# Patient Record
Sex: Male | Born: 1960 | Race: Asian | Hispanic: No | Marital: Married
Health system: Southern US, Community
[De-identification: ages and names within clinical notes are randomized; demographics above are authoritative.]

---

## 2010-05-18 ENCOUNTER — Emergency Department (HOSPITAL_COMMUNITY): Admission: EM | Admit: 2010-05-18 | Discharge: 2010-05-18 | Payer: Self-pay | Admitting: Emergency Medicine

## 2010-10-09 LAB — CBC
HCT: 43.4 % (ref 39.0–52.0)
Hemoglobin: 14.4 g/dL (ref 13.0–17.0)
MCH: 27.8 pg (ref 26.0–34.0)
MCHC: 33.2 g/dL (ref 30.0–36.0)
MCV: 83.8 fL (ref 78.0–100.0)
Platelets: 218 10*3/uL (ref 150–400)
RBC: 5.18 MIL/uL (ref 4.22–5.81)
RDW: 13.6 % (ref 11.5–15.5)
WBC: 4.8 10*3/uL (ref 4.0–10.5)

## 2010-10-09 LAB — DIFFERENTIAL
Basophils Absolute: 0 10*3/uL (ref 0.0–0.1)
Basophils Relative: 0 % (ref 0–1)
Eosinophils Absolute: 0.2 10*3/uL (ref 0.0–0.7)
Eosinophils Relative: 4 % (ref 0–5)
Lymphocytes Relative: 43 % (ref 12–46)
Lymphs Abs: 2 10*3/uL (ref 0.7–4.0)
Monocytes Absolute: 0.3 10*3/uL (ref 0.1–1.0)
Monocytes Relative: 6 % (ref 3–12)
Neutro Abs: 2.3 10*3/uL (ref 1.7–7.7)
Neutrophils Relative %: 48 % (ref 43–77)

## 2010-10-09 LAB — POCT I-STAT, CHEM 8
BUN: 18 mg/dL (ref 6–23)
Calcium, Ion: 1.13 mmol/L (ref 1.12–1.32)
Chloride: 107 mEq/L (ref 96–112)
Creatinine, Ser: 1.1 mg/dL (ref 0.4–1.5)
Glucose, Bld: 105 mg/dL — ABNORMAL HIGH (ref 70–99)
HCT: 45 % (ref 39.0–52.0)
Hemoglobin: 15.3 g/dL (ref 13.0–17.0)
Potassium: 3.7 mEq/L (ref 3.5–5.1)
Sodium: 140 mEq/L (ref 135–145)
TCO2: 25 mmol/L (ref 0–100)

## 2010-10-09 LAB — HEMOCCULT GUIAC POC 1CARD (OFFICE): Fecal Occult Bld: POSITIVE

## 2011-06-27 ENCOUNTER — Ambulatory Visit (HOSPITAL_COMMUNITY)
Admission: RE | Admit: 2011-06-27 | Discharge: 2011-06-27 | Disposition: A | Discharge status: Home / Self Care | Payer: Self-pay | Source: Ambulatory Visit | Attending: Rheumatology | Admitting: Rheumatology

## 2011-06-27 ENCOUNTER — Other Ambulatory Visit (HOSPITAL_COMMUNITY): Payer: Self-pay | Admitting: Rheumatology

## 2011-06-27 DIAGNOSIS — R937 Abnormal findings on diagnostic imaging of other parts of musculoskeletal system: Secondary | ICD-10-CM | POA: Insufficient documentation

## 2011-06-27 DIAGNOSIS — M25476 Effusion, unspecified foot: Secondary | ICD-10-CM | POA: Insufficient documentation

## 2011-06-27 DIAGNOSIS — M25473 Effusion, unspecified ankle: Secondary | ICD-10-CM | POA: Insufficient documentation

## 2011-06-27 DIAGNOSIS — R52 Pain, unspecified: Secondary | ICD-10-CM

## 2011-06-27 DIAGNOSIS — M25579 Pain in unspecified ankle and joints of unspecified foot: Secondary | ICD-10-CM | POA: Insufficient documentation

## 2015-03-03 ENCOUNTER — Emergency Department (HOSPITAL_COMMUNITY)
Admission: EM | Admit: 2015-03-03 | Discharge: 2015-03-03 | Disposition: A | Discharge status: Home / Self Care | Payer: 59 | Attending: Emergency Medicine | Admitting: Emergency Medicine

## 2015-03-03 DIAGNOSIS — M1000 Idiopathic gout, unspecified site: Secondary | ICD-10-CM | POA: Diagnosis not present

## 2015-03-03 DIAGNOSIS — M25561 Pain in right knee: Secondary | ICD-10-CM | POA: Diagnosis present

## 2015-03-03 LAB — COMPREHENSIVE METABOLIC PANEL
ALT: 19 U/L (ref 17–63)
AST: 22 U/L (ref 15–41)
Albumin: 3.8 g/dL (ref 3.5–5.0)
Alkaline Phosphatase: 69 U/L (ref 38–126)
Anion gap: 7 (ref 5–15)
BUN: 13 mg/dL (ref 6–20)
CO2: 23 mmol/L (ref 22–32)
Calcium: 8.7 mg/dL — ABNORMAL LOW (ref 8.9–10.3)
Chloride: 107 mmol/L (ref 101–111)
Creatinine, Ser: 0.84 mg/dL (ref 0.61–1.24)
GFR calc Af Amer: >60 mL/min (ref >60)
GFR calc non Af Amer: >60 mL/min (ref >60)
Glucose, Bld: 141 mg/dL — ABNORMAL HIGH (ref 65–99)
Potassium: 3.5 mmol/L (ref 3.5–5.1)
Sodium: 137 mmol/L (ref 135–145)
Total Bilirubin: 0.6 mg/dL (ref 0.3–1.2)
Total Protein: 7.3 g/dL (ref 6.5–8.1)

## 2015-03-03 LAB — CBC WITH DIFFERENTIAL/PLATELET
Basophils Absolute: 0 10*3/uL (ref 0.0–0.1)
Basophils Relative: 0 % (ref 0–1)
Eosinophils Absolute: 0.1 10*3/uL (ref 0.0–0.7)
Eosinophils Relative: 1 % (ref 0–5)
HCT: 38.1 % — ABNORMAL LOW (ref 39.0–52.0)
Hemoglobin: 12.2 g/dL — ABNORMAL LOW (ref 13.0–17.0)
Lymphocytes Relative: 21 % (ref 12–46)
Lymphs Abs: 1.9 10*3/uL (ref 0.7–4.0)
MCH: 27 pg (ref 26.0–34.0)
MCHC: 32 g/dL (ref 30.0–36.0)
MCV: 84.3 fL (ref 78.0–100.0)
Monocytes Absolute: 0.5 10*3/uL (ref 0.1–1.0)
Monocytes Relative: 6 % (ref 3–12)
Neutro Abs: 6.3 10*3/uL (ref 1.7–7.7)
Neutrophils Relative %: 72 % (ref 43–77)
Platelets: 330 10*3/uL (ref 150–400)
RBC: 4.52 MIL/uL (ref 4.22–5.81)
RDW: 14 % (ref 11.5–15.5)
WBC: 8.8 10*3/uL (ref 4.0–10.5)

## 2015-03-03 MED ORDER — OXYCODONE-ACETAMINOPHEN 5-325 MG PO TABS: 1.0000 | ORAL_TABLET | Freq: Four times a day (QID) | ORAL | Status: DC | PRN

## 2015-03-03 MED ORDER — IBUPROFEN 800 MG PO TABS: 800.0000 mg | ORAL_TABLET | Freq: Three times a day (TID) | ORAL | Status: AC

## 2015-03-03 MED ORDER — MORPHINE SULFATE 4 MG/ML IJ SOLN
4.0000 mg | Freq: Once | INTRAMUSCULAR | Status: AC
  Administered 2015-03-03: 4 mg via INTRAVENOUS
  Filled 2015-03-03: qty 1

## 2015-03-03 MED ORDER — PREDNISONE 20 MG PO TABS
60.0000 mg | ORAL_TABLET | Freq: Once | ORAL | Status: AC
  Administered 2015-03-03: 60 mg via ORAL
  Filled 2015-03-03: qty 3

## 2015-03-03 MED ORDER — HYDROMORPHONE HCL 1 MG/ML IJ SOLN
1.0000 mg | Freq: Once | INTRAMUSCULAR | Status: AC
  Administered 2015-03-03: 1 mg via INTRAVENOUS
  Filled 2015-03-03: qty 1

## 2015-03-03 MED ORDER — SODIUM CHLORIDE 0.9 % IV BOLUS (SEPSIS)
1000.0000 mL | Freq: Once | INTRAVENOUS | Status: AC
  Administered 2015-03-03: 1000 mL via INTRAVENOUS

## 2015-03-03 MED ORDER — PREDNISONE 20 MG PO TABS: ORAL_TABLET | ORAL | Status: AC

## 2015-03-03 MED ORDER — ONDANSETRON 8 MG PO TBDP
8.0000 mg | ORAL_TABLET | Freq: Once | ORAL | Status: AC
  Administered 2015-03-03: 8 mg via ORAL
  Filled 2015-03-03: qty 1

## 2015-03-03 NOTE — ED Notes (Addendum)
Addendum-patient reports taking approximately nine 0.6mg  Colchicine today (within 12 hours) and Ibuprofen (unknown dosage) 3-4 tablets every three hours yesterday.

## 2015-03-03 NOTE — ED Notes (Addendum)
Spoke with Poison Control:   Recommendations: (expected N/V/D possibly) Symptomatic treatment BUN, Creatinine, Electrolytes for renal check d/t Ibuprofen dosages  Spoke with Revonda Standard at [employer]

## 2015-03-03 NOTE — Discharge Instructions (Signed)
Take motrin 800 mg three times daily for the next 2-3 days until your pain is under control then as needed.  Take prednisone as prescribed.   Take percocet for severe pain. DO NOT drive with it.   See your doctor.   Return to ER if you have severe pain, fever, worse joint swelling, unable to walk.

## 2015-03-03 NOTE — ED Notes (Signed)
Bed: EA54 Expected date:  Expected time:  Means of arrival:  Comments: Gout, took mail order meds and "feels funny"

## 2015-03-03 NOTE — ED Provider Notes (Signed)
CSN: [id number]     Arrival date & time 03/03/15  1550 History   First MD Initiated Contact with Patient 03/03/15 1622     Chief Complaint  Patient presents with  . Gout Medication Overdose      (Consider location/radiation/quality/duration/timing/severity/associated sxs/prior Treatment) The history is provided by the patient.  Philip Holmes is a 54 y.o. male here with R knee pain. Patient was diagnosed with gout several weeks ago by primary care doctor. He was prescribed allopurinol but ran out of it. Patient has been having worsening right knee pain for the last 3 days. He took Motrin yesterday didn't help and took his friend's colchicine today. He took about 9 pills of 0.6 mg of colchicine and his pain is minimally improved. Denies any nausea or vomiting. He came in because he still in severe pain. Denies fever or chills or hx of septic joint.     No past medical history on file. No past surgical history on file. No family history on file. History  Substance Use Topics  . Smoking status: Not on file  . Smokeless tobacco: Not on file  . Alcohol Use: Not on file    Review of Systems  Musculoskeletal:       R knee pain   All other systems reviewed and are negative.     Allergies  Review of patient's allergies indicates no known allergies.  Home Medications   Prior to Admission medications   Medication Sig Start Date End Date Taking? Authorizing Provider  colchicine 0.6 MG tablet Take 0.12-0.18 mg by mouth every 3 (three) hours as needed (for gout).   Yes Historical Provider, MD  ibuprofen (ADVIL,MOTRIN) 200 MG tablet Take 600-800 mg by mouth every 3 (three) hours as needed for fever, headache, mild pain, moderate pain or cramping.   Yes Historical Provider, MD   BP 132/91 mmHg  Pulse 73  Temp(Src) 98.6 F (37 C) (Oral)  Resp 18  SpO2 98% Physical Exam  Constitutional: He is oriented to person, place, and time.  Uncomfortable   HENT:  Head: Normocephalic.   Mouth/Throat: Oropharynx is clear and moist.  Eyes: Conjunctivae are normal. Pupils are equal, round, and reactive to light.  Neck: Normal range of motion. Neck supple.  Cardiovascular: Normal rate, regular rhythm and normal heart sounds.   Pulmonary/Chest: Effort normal and breath sounds normal. No respiratory distress. He has no wheezes. He has no rales.  Abdominal: Soft. Bowel sounds are normal. He exhibits no distension. There is no tenderness. There is no rebound.  Musculoskeletal:  R knee redness around the prepatellar space, no obvious knee effusion, nl ROM R knee. Minimal R ankle swelling with no redness.   Neurological: He is alert and oriented to person, place, and time.  Skin: Skin is warm and dry.  Psychiatric: He has a normal mood and affect. His behavior is normal. Judgment and thought content normal.  Nursing note and vitals reviewed.   ED Course  Procedures (including critical care time) Labs Review Labs Reviewed  CBC WITH DIFFERENTIAL/PLATELET - Abnormal; Notable for the following:    Hemoglobin 12.2 (*)    HCT 38.1 (*)    All other components within normal limits  COMPREHENSIVE METABOLIC PANEL - Abnormal; Notable for the following:    Glucose, Bld 141 (*)    Calcium 8.7 (*)    All other components within normal limits    Imaging Review No results found.   EKG Interpretation None  MDM   Final diagnoses:  None   Philip Holmes is a 54 y.o. male here with R knee pain, colchicine use. Poison control contacted by nursing and recommend check Cr and observe for vomiting. Patient didn't take a toxic dose of colchicine. Knee pain likely prepatellar bursitis vs gout. No signs of septic knee. Will check labs, give pain meds and steroids for gout attack.   6:25 PM Cr nl. Felt better with pain meds and steroids. Will dc home with prednisone, percocet, motrin.     Richardean Canal, MD 03/03/15 [phone]

## 2015-03-03 NOTE — ED Notes (Signed)
Printed out foods to avoid with gout. Knows to follow up with PCP for Allopurinol prescription. No other c/c.

## 2015-03-03 NOTE — ED Notes (Signed)
Updated Revonda Standard at [employer] on patient's discharge condition.

## 2015-03-03 NOTE — ED Notes (Signed)
Per EMS- (vietnames speaking patient) has new diagnosis with gout. Out of Allopurinol. Friend gave him Colchicine-package indicates medication is possibly from [place]. Says he "feels funny." VS: 132 palp RR 20 SpO2 100% HR 84. Neurologically intact. Ambulatory for EMS.

## 2015-11-02 DIAGNOSIS — R030 Elevated blood-pressure reading, without diagnosis of hypertension: Secondary | ICD-10-CM | POA: Diagnosis not present

## 2015-11-02 DIAGNOSIS — M109 Gout, unspecified: Secondary | ICD-10-CM | POA: Diagnosis not present

## 2015-11-02 DIAGNOSIS — K625 Hemorrhage of anus and rectum: Secondary | ICD-10-CM | POA: Diagnosis not present

## 2015-11-02 DIAGNOSIS — M898X9 Other specified disorders of bone, unspecified site: Secondary | ICD-10-CM | POA: Diagnosis not present

## 2015-12-14 DIAGNOSIS — K921 Melena: Secondary | ICD-10-CM | POA: Diagnosis not present

## 2015-12-21 DIAGNOSIS — K921 Melena: Secondary | ICD-10-CM | POA: Diagnosis not present

## 2015-12-21 DIAGNOSIS — M109 Gout, unspecified: Secondary | ICD-10-CM | POA: Diagnosis not present

## 2016-01-11 DIAGNOSIS — K5730 Diverticulosis of large intestine without perforation or abscess without bleeding: Secondary | ICD-10-CM | POA: Diagnosis not present

## 2016-01-11 DIAGNOSIS — K921 Melena: Secondary | ICD-10-CM | POA: Diagnosis not present

## 2016-01-11 DIAGNOSIS — K648 Other hemorrhoids: Secondary | ICD-10-CM | POA: Diagnosis not present

## 2016-09-05 DIAGNOSIS — K30 Functional dyspepsia: Secondary | ICD-10-CM | POA: Diagnosis not present

## 2016-09-12 DIAGNOSIS — K5792 Diverticulitis of intestine, part unspecified, without perforation or abscess without bleeding: Secondary | ICD-10-CM | POA: Diagnosis not present

## 2017-09-18 DIAGNOSIS — Z23 Encounter for immunization: Secondary | ICD-10-CM | POA: Diagnosis not present

## 2017-09-18 DIAGNOSIS — M109 Gout, unspecified: Secondary | ICD-10-CM | POA: Diagnosis not present

## 2017-12-18 DIAGNOSIS — R05 Cough: Secondary | ICD-10-CM | POA: Diagnosis not present

## 2017-12-18 DIAGNOSIS — M109 Gout, unspecified: Secondary | ICD-10-CM | POA: Diagnosis not present

## 2019-12-27 DIAGNOSIS — M109 Gout, unspecified: Secondary | ICD-10-CM | POA: Diagnosis not present

## 2019-12-27 DIAGNOSIS — R7309 Other abnormal glucose: Secondary | ICD-10-CM | POA: Diagnosis not present

## 2019-12-27 DIAGNOSIS — Z125 Encounter for screening for malignant neoplasm of prostate: Secondary | ICD-10-CM | POA: Diagnosis not present

## 2019-12-27 DIAGNOSIS — Z0000 Encounter for general adult medical examination without abnormal findings: Secondary | ICD-10-CM | POA: Diagnosis not present

## 2019-12-27 DIAGNOSIS — K921 Melena: Secondary | ICD-10-CM | POA: Diagnosis not present

## 2019-12-27 DIAGNOSIS — E782 Mixed hyperlipidemia: Secondary | ICD-10-CM | POA: Diagnosis not present

## 2019-12-28 DIAGNOSIS — Z1211 Encounter for screening for malignant neoplasm of colon: Secondary | ICD-10-CM | POA: Diagnosis not present

## 2020-01-20 DIAGNOSIS — E119 Type 2 diabetes mellitus without complications: Secondary | ICD-10-CM | POA: Diagnosis not present

## 2020-01-20 DIAGNOSIS — M1A0721 Idiopathic chronic gout, left ankle and foot, with tophus (tophi): Secondary | ICD-10-CM | POA: Diagnosis not present

## 2020-01-20 DIAGNOSIS — E781 Pure hyperglyceridemia: Secondary | ICD-10-CM | POA: Diagnosis not present

## 2020-01-27 DIAGNOSIS — E782 Mixed hyperlipidemia: Secondary | ICD-10-CM | POA: Diagnosis not present

## 2020-01-27 DIAGNOSIS — E119 Type 2 diabetes mellitus without complications: Secondary | ICD-10-CM | POA: Diagnosis not present

## 2020-04-12 DIAGNOSIS — E119 Type 2 diabetes mellitus without complications: Secondary | ICD-10-CM | POA: Diagnosis not present

## 2020-04-12 DIAGNOSIS — M109 Gout, unspecified: Secondary | ICD-10-CM | POA: Diagnosis not present

## 2020-04-12 DIAGNOSIS — Z79899 Other long term (current) drug therapy: Secondary | ICD-10-CM | POA: Diagnosis not present

## 2020-04-12 DIAGNOSIS — E782 Mixed hyperlipidemia: Secondary | ICD-10-CM | POA: Diagnosis not present

## 2020-04-20 DIAGNOSIS — R05 Cough: Secondary | ICD-10-CM | POA: Diagnosis not present

## 2020-04-20 DIAGNOSIS — Z111 Encounter for screening for respiratory tuberculosis: Secondary | ICD-10-CM | POA: Diagnosis not present

## 2020-10-03 ENCOUNTER — Emergency Department (HOSPITAL_COMMUNITY)
Admission: EM | Admit: 2020-10-03 | Discharge: 2020-10-03 | Disposition: A | Discharge status: Home / Self Care | Payer: 59 | Attending: Emergency Medicine | Admitting: Emergency Medicine

## 2020-10-03 ENCOUNTER — Emergency Department (HOSPITAL_COMMUNITY): Payer: 59

## 2020-10-03 ENCOUNTER — Other Ambulatory Visit: Payer: Self-pay

## 2020-10-03 ENCOUNTER — Encounter (HOSPITAL_COMMUNITY): Payer: Self-pay | Admitting: *Deleted

## 2020-10-03 DIAGNOSIS — I10 Essential (primary) hypertension: Secondary | ICD-10-CM | POA: Diagnosis not present

## 2020-10-03 DIAGNOSIS — J029 Acute pharyngitis, unspecified: Secondary | ICD-10-CM | POA: Diagnosis present

## 2020-10-03 DIAGNOSIS — J36 Peritonsillar abscess: Secondary | ICD-10-CM | POA: Insufficient documentation

## 2020-10-03 LAB — CBC WITH DIFFERENTIAL/PLATELET
Abs Immature Granulocytes: 0.03 10*3/uL (ref 0.00–0.07)
Basophils Absolute: 0 10*3/uL (ref 0.0–0.1)
Basophils Relative: 0 %
Eosinophils Absolute: 0.2 10*3/uL (ref 0.0–0.5)
Eosinophils Relative: 2 %
HCT: 42.5 % (ref 39.0–52.0)
Hemoglobin: 13.7 g/dL (ref 13.0–17.0)
Immature Granulocytes: 0 %
Lymphocytes Relative: 23 %
Lymphs Abs: 2.1 10*3/uL (ref 0.7–4.0)
MCH: 28.1 pg (ref 26.0–34.0)
MCHC: 32.2 g/dL (ref 30.0–36.0)
MCV: 87.3 fL (ref 80.0–100.0)
Monocytes Absolute: 0.9 10*3/uL (ref 0.1–1.0)
Monocytes Relative: 9 %
Neutro Abs: 5.9 10*3/uL (ref 1.7–7.7)
Neutrophils Relative %: 66 %
Platelets: 281 10*3/uL (ref 150–400)
RBC: 4.87 MIL/uL (ref 4.22–5.81)
RDW: 14.4 % (ref 11.5–15.5)
WBC: 9.1 10*3/uL (ref 4.0–10.5)
nRBC: 0 % (ref 0.0–0.2)

## 2020-10-03 LAB — BASIC METABOLIC PANEL
Anion gap: 11 (ref 5–15)
BUN: 17 mg/dL (ref 6–20)
CO2: 25 mmol/L (ref 22–32)
Calcium: 9.5 mg/dL (ref 8.9–10.3)
Chloride: 103 mmol/L (ref 98–111)
Creatinine, Ser: 0.83 mg/dL (ref 0.61–1.24)
GFR, Estimated: >60 mL/min (ref >60)
Glucose, Bld: 92 mg/dL (ref 70–99)
Potassium: 3.3 mmol/L — ABNORMAL LOW (ref 3.5–5.1)
Sodium: 139 mmol/L (ref 135–145)

## 2020-10-03 LAB — GROUP A STREP BY PCR: Group A Strep by PCR: NOT DETECTED

## 2020-10-03 MED ORDER — HYDROCODONE-ACETAMINOPHEN 7.5-325 MG/15ML PO SOLN
10.0000 mL | Freq: Once | ORAL | Status: AC
  Administered 2020-10-03: 10 mL via ORAL
  Filled 2020-10-03: qty 15

## 2020-10-03 MED ORDER — SODIUM CHLORIDE 0.9 % IV BOLUS
1000.0000 mL | Freq: Once | INTRAVENOUS | Status: AC
  Administered 2020-10-03: 1000 mL via INTRAVENOUS

## 2020-10-03 MED ORDER — CLINDAMYCIN HCL 150 MG PO CAPS: 150.0000 mg | ORAL_CAPSULE | Freq: Four times a day (QID) | ORAL | 0 refills | Status: AC

## 2020-10-03 MED ORDER — DEXAMETHASONE SODIUM PHOSPHATE 10 MG/ML IJ SOLN
10.0000 mg | Freq: Once | INTRAMUSCULAR | Status: AC
  Administered 2020-10-03: 10 mg via INTRAVENOUS
  Filled 2020-10-03: qty 1

## 2020-10-03 MED ORDER — CLINDAMYCIN PHOSPHATE 300 MG/50ML IV SOLN
300.0000 mg | Freq: Once | INTRAVENOUS | Status: AC
  Administered 2020-10-03: 300 mg via INTRAVENOUS
  Filled 2020-10-03: qty 50

## 2020-10-03 MED ORDER — HYDROCODONE-ACETAMINOPHEN 5-325 MG PO TABS: 1.0000 | ORAL_TABLET | Freq: Four times a day (QID) | ORAL | 0 refills | Status: AC | PRN

## 2020-10-03 MED ORDER — IOHEXOL 300 MG/ML  SOLN
75.0000 mL | Freq: Once | INTRAMUSCULAR | Status: AC | PRN
  Administered 2020-10-03: 75 mL via INTRAVENOUS

## 2020-10-03 NOTE — Discharge Instructions (Signed)
Please take antibiotic as prescribed for the full duration.  Eat yogurt high in probiotic while taking antibiotic to prevent antibiotic related diarrhea.  Take pain medication as needed but be aware it may cause drowsiness.  Return if you have any concerns. It was my pleasure taking care of you today.   Vui lng u?ng khng sinh theo quy ??nh trong th?i gian ??y ??. ?n s?a chua c nhi?u probiotic trong khi u?ng thu?c khng sinh ?? ng?n ng?a tiu ch?y lin quan ??n khng sinh. U?ng thu?c gi?m ?au khi c?n thi?t nh?ng l?u  thu?c c th? gy bu?n ng?. Tr? l?i n?u b?n c b?t k? m?i quan tm no. Ti r?t vui khi ???c ch?m [employer] b?n hm nay.

## 2020-10-03 NOTE — ED Provider Notes (Signed)
[facility]-EMERGENCY DEPT Provider Note   CSN: [id number] Arrival date & time: 10/03/20  1304     History Chief Complaint  Patient presents with  . Sore Throat    Philip Holmes is a 60 y.o. male patient presenting for evaluation of throat pain.   History obtained with interpreter service as patient speaks [place]  Patient states he has had pain for the past 2 days.  It is persistent, worse when he swallows and eats.  He took Tylenol for pain without improvement of symptoms.  He has a mild cough, but mostly reports a lot of phlegm in his throat.  He states his voice is hoarse, but not muffled.  No fevers.  It is difficult for him to open his mouth due to pain.  He denies sick contacts.  No chest pain, shortness of breath, nausea, vomiting or abdominal pain.  He has a history of hypertension, hyperlipidemia, and gout for which he takes medication.   HPI     History reviewed. No pertinent past medical history.  There are no problems to display for this patient.   History reviewed. No pertinent surgical history.     No family history on file.     Home Medications Prior to Admission medications   Medication Sig Start Date End Date Taking? Authorizing Provider  colchicine 0.6 MG tablet Take 0.12-0.18 mg by mouth every 3 (three) hours as needed (for gout).    [provider]  ibuprofen (ADVIL,MOTRIN) 800 MG tablet Take 1 tablet (800 mg total) by mouth 3 (three) times daily. 03/03/15   Charlynne Pander, MD  oxyCODONE-acetaminophen (PERCOCET) 5-325 MG per tablet Take 1 tablet by mouth every 6 (six) hours as needed. 03/03/15   Charlynne Pander, MD  predniSONE (DELTASONE) 20 MG tablet Take 60 mg daily x 2 days then 40 mg daily x 2 days then 20 mg daily x 2 days 03/03/15   Charlynne Pander, MD    Allergies    Patient has no known allergies.  Review of Systems   Review of Systems  HENT: Positive for sore throat and trouble swallowing.    Respiratory: Positive for cough.   All other systems reviewed and are negative.   Physical Exam Updated Vital Signs BP (!) 145/97 (BP Location: Right Arm)   Pulse 86   Temp 97.8 F (36.6 C) (Oral)   Resp 20   SpO2 100%   Physical Exam Vitals and nursing note reviewed.  Constitutional:      General: He is not in acute distress.    Appearance: He is well-developed and well-nourished.     Comments: In NAD  HENT:     Head: Normocephalic and atraumatic.     Mouth/Throat:     Mouth: Mucous membranes are moist.     Pharynx: Pharyngeal swelling and posterior oropharyngeal erythema present.     Tonsils: Tonsillar abscess present. 1+ on the right. 3+ on the left.     Comments: Left-sided tonsillar swelling causing uvular deviation.  Minimal trismus.  No muffled voice.  Handling secretions easily. Eyes:     Extraocular Movements: EOM normal.     Conjunctiva/sclera: Conjunctivae normal.     Pupils: Pupils are equal, round, and reactive to light.  Cardiovascular:     Rate and Rhythm: Normal rate and regular rhythm.     Pulses: Normal pulses and intact distal pulses.  Pulmonary:     Effort: Pulmonary effort is normal. No respiratory distress.  Breath sounds: Normal breath sounds. No wheezing.     Comments: Clear lung sounds Abdominal:     General: There is no distension.     Palpations: Abdomen is soft. There is no mass.     Tenderness: There is no abdominal tenderness. There is no guarding or rebound.  Musculoskeletal:        General: Normal range of motion.     Cervical back: Normal range of motion and neck supple.  Skin:    General: Skin is warm and dry.     Capillary Refill: Capillary refill takes less than 2 seconds.  Neurological:     Mental Status: He is alert and oriented to person, place, and time.  Psychiatric:        Mood and Affect: Mood and affect normal.     ED Results / Procedures / Treatments   Labs (all labs ordered are listed, but only abnormal results  are displayed) Labs Reviewed  CBC WITH DIFFERENTIAL/PLATELET  BASIC METABOLIC PANEL    EKG None  Radiology No results found.  Procedures Procedures   Medications Ordered in ED Medications  sodium chloride 0.9 % bolus 1,000 mL (has no administration in time range)  dexamethasone (DECADRON) injection 10 mg (has no administration in time range)  clindamycin (CLEOCIN) IVPB 300 mg (has no administration in time range)    ED Course  I have reviewed the triage vital signs and the nursing notes.  Pertinent labs & imaging results that were available during my care of the patient were reviewed by me and considered in my medical decision making (see chart for details).    MDM Rules/Calculators/A&P                          Patient presenting for evaluation of throat swelling/pain.  On exam, patient appears nontoxic.  He does have unilateral tonsillar swelling causing minimal uvular deviation.  He does have mild trismus, no hot potato voice.  No fevers.  Likely peritonsillar abscess, however in the setting of difficult exam due to trismus and language barrier, will obtain imaging for further evaluation.  Will obtain labs and treat symptomatically with fluids, Decadron, antibiotics.  Pt signed out to [facility] pending labs, CT and possible need for ENT consult.   Final Clinical Impression(s) / ED Diagnoses Final diagnoses:  None    Rx / DC Orders ED Discharge Orders    None       Alveria Apley, PA-C 10/03/20 1528    Gwyneth Sprout, MD 10/03/20 2120

## 2020-10-03 NOTE — ED Provider Notes (Signed)
Received signout at the beginning of shift, please see previous providers note for complete H&P.  This is a 60 year old male presenting with complaints of sore throat for the past 2 to 3 days.  He is having some trouble with hoarseness as well as trouble swallowing and at times difficulty breathing.  Increasing pain with eating or drinking but able to.  Patient was started on Decadron, clindamycin, and a neck soft tissue study was obtained.  His labs are reassuring.  Normal WBC.  CT scan demonstrate a developing 17 mm abscess in the left tonsil with intact airway however asymmetric vocal cord, right cord paresis.  He does have some mild hoarseness on exam.  Will consult ENT for recommendation.  5:31 PM Appreciate consultation from on call ENT Dr Jearld Fenton who voice concerns of vocal cord paresis.  He will see pt in the ER for further evaluation. I strep test have been ordered. I have ordered Hycet for pain control.   8:08 PM ENT Dr. Jearld Fenton have evaluated pt and performed I&D and able to evacuate a large amount of purulent material from L tonsil.  He recommend Lortab and clindamycin along with outpt f/u.    BP (!) 140/93   Pulse 82   Temp 97.8 F (36.6 C) (Oral)   Resp 18   SpO2 94%   Results for orders placed or performed during the hospital encounter of 10/03/20  CBC with Differential  Result Value Ref Range   WBC 9.1 4.0 - 10.5 K/uL   RBC 4.87 4.22 - 5.81 MIL/uL   Hemoglobin 13.7 13.0 - 17.0 g/dL   HCT 54.0 98.1 - 19.1 %   MCV 87.3 80.0 - 100.0 fL   MCH 28.1 26.0 - 34.0 pg   MCHC 32.2 30.0 - 36.0 g/dL   RDW 47.8 29.5 - 62.1 %   Platelets 281 150 - 400 K/uL   nRBC 0.0 0.0 - 0.2 %   Neutrophils Relative % 66 %   Neutro Abs 5.9 1.7 - 7.7 K/uL   Lymphocytes Relative 23 %   Lymphs Abs 2.1 0.7 - 4.0 K/uL   Monocytes Relative 9 %   Monocytes Absolute 0.9 0.1 - 1.0 K/uL   Eosinophils Relative 2 %   Eosinophils Absolute 0.2 0.0 - 0.5 K/uL   Basophils Relative 0 %   Basophils Absolute 0.0 0.0  - 0.1 K/uL   Immature Granulocytes 0 %   Abs Immature Granulocytes 0.03 0.00 - 0.07 K/uL  Basic metabolic panel  Result Value Ref Range   Sodium 139 135 - 145 mmol/L   Potassium 3.3 (L) 3.5 - 5.1 mmol/L   Chloride 103 98 - 111 mmol/L   CO2 25 22 - 32 mmol/L   Glucose, Bld 92 70 - 99 mg/dL   BUN 17 6 - 20 mg/dL   Creatinine, Ser 3.08 0.61 - 1.24 mg/dL   Calcium 9.5 8.9 - 65.7 mg/dL   GFR, Estimated >84 >69 mL/min   Anion gap 11 5 - 15   CT Soft Tissue Neck W Contrast  Result Date: 10/03/2020 CLINICAL DATA:  Suspect peritonsillar abscess.  Throat pain 2 days. EXAM: CT NECK WITH CONTRAST TECHNIQUE: Multidetector CT imaging of the neck was performed using the standard protocol following the bolus administration of intravenous contrast. CONTRAST:  9mL OMNIPAQUE IOHEXOL 300 MG/ML  SOLN COMPARISON:  None. FINDINGS: Pharynx and larynx: Asymmetric enlargement of the left pharyngeal tonsil. Ill-defined low-density in the left tonsil measuring 17 mm, most likely a developing abscess which  is not yet liquified. Airway intact. Asymmetric vocal cord. Right vocal cord is deviated medially with asymmetric enlargement of the right piriform sinus suggesting right cord paresis. Salivary glands: No inflammation, mass, or stone. Thyroid: Negative Lymph nodes: No enlarged lymph nodes in the neck. 11 mm left level 2 lymph node is the largest node. Vascular: Normal vascular enhancement. Limited intracranial: Negative Visualized orbits: Negative Mastoids and visualized paranasal sinuses: Mild mucosal edema paranasal sinuses. Mastoid clear bilaterally. Skeleton: Disc degeneration and spurring most prominent C5-6 on the right. No acute skeletal abnormality. Upper chest: Lung apices clear bilaterally. Other: None IMPRESSION: Developing 17 mm abscess left tonsil.  Airway intact Asymmetric vocal cords. Right cord paresis. Correlate with hoarseness. Electronically Signed   By: Marlan Palau M.D.   On: 10/03/2020 16:38       Fayrene Helper, PA-C 10/03/20 2013    Pollyann Savoy, MD 10/05/20 1455

## 2020-10-03 NOTE — ED Triage Notes (Signed)
Pt sent by PCP at [facility] for throat infection. Pt reports pain x 2 days. He has difficulty eating and drinking.

## 2020-10-03 NOTE — Consult Note (Signed)
Reason for Consult: Tonsil abscess Referring Physician: ER  Philip Holmes is an 60 y.o. male.  HPI: 3-day history of sore throat that has increased with now difficulty with drinking or swallowing anything.  It is left-sided discomfort.  He has not had previous peritonsillar abscess issues.  He has not had an upper respiratory infection prior to the onset.  He had some hoarseness a few days ago but both patient and wife say his voice is normal now.  CT scan showed a area in the left tonsil peritonsillar region that was consistent with a possible evolving abscess of 1.7 cm.  There is also possibly some suggestion that there was a right vocal cord paralysis based on the medial deviation of the arytenoid.  He has no problems with low volume voice.  History reviewed. No pertinent past medical history.  History reviewed. No pertinent surgical history.  No family history on file.  Social History:  has no history on file for tobacco use, alcohol use, and drug use.  Allergies: No Known Allergies  Medications: I have reviewed the patient's current medications.  Results for orders placed or performed during the hospital encounter of 10/03/20 (from the past 48 hour(s))  CBC with Differential     Status: None   Collection Time: 10/03/20  2:38 PM  Result Value Ref Range   WBC 9.1 4.0 - 10.5 K/uL   RBC 4.87 4.22 - 5.81 MIL/uL   Hemoglobin 13.7 13.0 - 17.0 g/dL   HCT 86.7 61.9 - 50.9 %   MCV 87.3 80.0 - 100.0 fL   MCH 28.1 26.0 - 34.0 pg   MCHC 32.2 30.0 - 36.0 g/dL   RDW 32.6 71.2 - 45.8 %   Platelets 281 150 - 400 K/uL   nRBC 0.0 0.0 - 0.2 %   Neutrophils Relative % 66 %   Neutro Abs 5.9 1.7 - 7.7 K/uL   Lymphocytes Relative 23 %   Lymphs Abs 2.1 0.7 - 4.0 K/uL   Monocytes Relative 9 %   Monocytes Absolute 0.9 0.1 - 1.0 K/uL   Eosinophils Relative 2 %   Eosinophils Absolute 0.2 0.0 - 0.5 K/uL   Basophils Relative 0 %   Basophils Absolute 0.0 0.0 - 0.1 K/uL   Immature Granulocytes 0 %    Abs Immature Granulocytes 0.03 0.00 - 0.07 K/uL    Comment: Performed at [facility], [address] [place], [place] [address]  Basic metabolic panel     Status: Abnormal   Collection Time: 10/03/20  2:38 PM  Result Value Ref Range   Sodium 139 135 - 145 mmol/L   Potassium 3.3 (L) 3.5 - 5.1 mmol/L   Chloride 103 98 - 111 mmol/L   CO2 25 22 - 32 mmol/L   Glucose, Bld 92 70 - 99 mg/dL    Comment: Glucose reference range applies only to samples taken after fasting for at least 8 hours.   BUN 17 6 - 20 mg/dL   Creatinine, Ser 3.82 0.61 - 1.24 mg/dL   Calcium 9.5 8.9 - 50.5 mg/dL   GFR, Estimated >39 >76 mL/min    Comment: (NOTE) Calculated using the CKD-EPI Creatinine Equation (2021)    Anion gap 11 5 - 15    Comment: Performed at [facility], [address] [place], [place] [address]    CT Soft Tissue Neck W Contrast  Result Date: 10/03/2020 CLINICAL DATA:  Suspect peritonsillar abscess.  Throat pain 2 days. EXAM: CT  NECK WITH CONTRAST TECHNIQUE: Multidetector CT imaging of the neck was performed using the standard protocol following the bolus administration of intravenous contrast. CONTRAST:  12mL OMNIPAQUE IOHEXOL 300 MG/ML  SOLN COMPARISON:  None. FINDINGS: Pharynx and larynx: Asymmetric enlargement of the left pharyngeal tonsil. Ill-defined low-density in the left tonsil measuring 17 mm, most likely a developing abscess which is not yet liquified. Airway intact. Asymmetric vocal cord. Right vocal cord is deviated medially with asymmetric enlargement of the right piriform sinus suggesting right cord paresis. Salivary glands: No inflammation, mass, or stone. Thyroid: Negative Lymph nodes: No enlarged lymph nodes in the neck. 11 mm left level 2 lymph node is the largest node. Vascular: Normal vascular enhancement. Limited intracranial: Negative Visualized orbits: Negative Mastoids and visualized paranasal sinuses: Mild mucosal edema paranasal sinuses.  Mastoid clear bilaterally. Skeleton: Disc degeneration and spurring most prominent C5-6 on the right. No acute skeletal abnormality. Upper chest: Lung apices clear bilaterally. Other: None IMPRESSION: Developing 17 mm abscess left tonsil.  Airway intact Asymmetric vocal cords. Right cord paresis. Correlate with hoarseness. Electronically Signed   By: Marlan Palau M.D.   On: 10/03/2020 16:38    ROS Blood pressure (!) 140/93, pulse 82, temperature 97.8 F (36.6 C), temperature source Oral, resp. rate 18, SpO2 94 %. Physical Exam Constitutional:      Appearance: He is well-developed.  HENT:     Head:     Comments: His voice is normal sounding with good volume and good strength.  Nothing that is consistent with concern for vocal cord weakness.  He has no significant trismus.  His left tonsil is obviously asymmetric to the right.  The soft tissue are bulging medially.  The uvula is without significant edema.  His tongue was without significant swelling or edema.  There is obvious erythema of the left tonsil and soft palate. Eyes:     Conjunctiva/sclera: Conjunctivae normal.     Pupils: Pupils are equal, round, and reactive to light.  Musculoskeletal:     Cervical back: Normal range of motion and neck supple.  Neurological:     Mental Status: He is alert.      Left peritonsillar abscess incision and drainage The patient and wife were informed the risk and benefits of the procedure and options were discussed all questions were answered and consent was obtained.  The patient was sprayed with Cetacaine and then injected in the left peritonsillar region with 1% lidocaine with 1 100,000 epinephrine.  Incision was made with a 15 blade and immediately the pus was expressed with the cut.  It was open with a tonsil hemostat.  Significant amount of purulence was expressed.  He tolerated the procedure well.  There was minimal bleeding.    Assessment/Plan: Left peritonsillar abscess-he consented to an  I&D which was performed and significant amount of pus was evacuated.  He will be placed on clindamycin and some Lortab.  He will follow-up in 1-2 weeks.  He should continue to improve over the next 48 to 72 hours to the point that he is almost completely resolved at that point.  Follow-up sooner if not.  Suzanna Obey 10/03/2020, 7:10 PM

## 2022-09-11 IMAGING — CT CT NECK W/ CM
4 series · 14 of 33 positions shown, 17 images · IV contrast (omnipaque)
Comparison: None.

CLINICAL DATA: Suspect peritonsillar abscess.  Throat pain 2 days.

EXAM:
CT NECK WITH CONTRAST
TECHNIQUE: Multidetector CT imaging of the neck was performed using the
standard protocol following the bolus administration of intravenous
contrast.
CONTRAST:  75mL OMNIPAQUE IOHEXOL 300 MG/ML  SOLN

[Series 3: axial neck · axial · 0.47mm/px · z∈[-217,-61]mm · 5 of 118 slices shown, 7 images]
[im 20/118  soft-tissue]
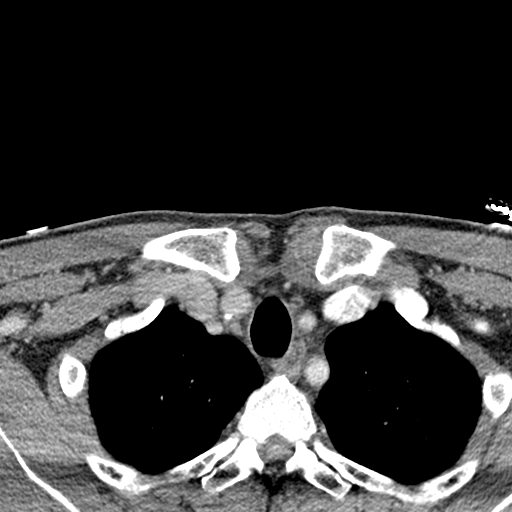
[im 20/118  bone]
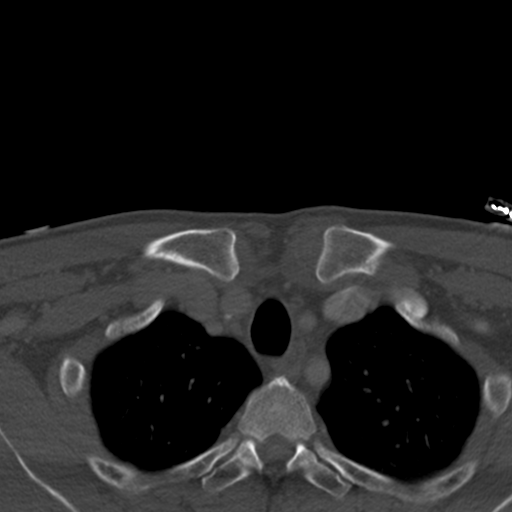
[im 40/118  bone]
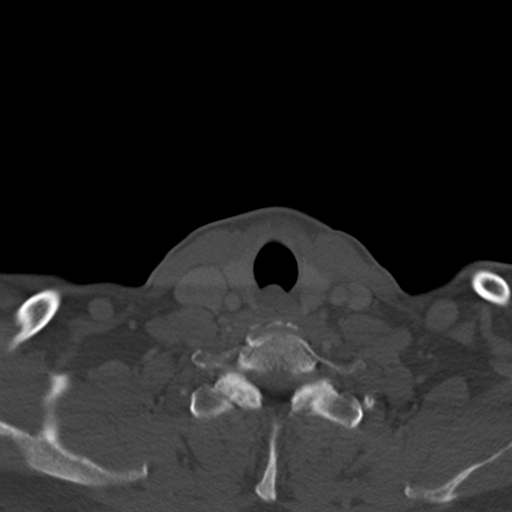
[im 59/118  bone]
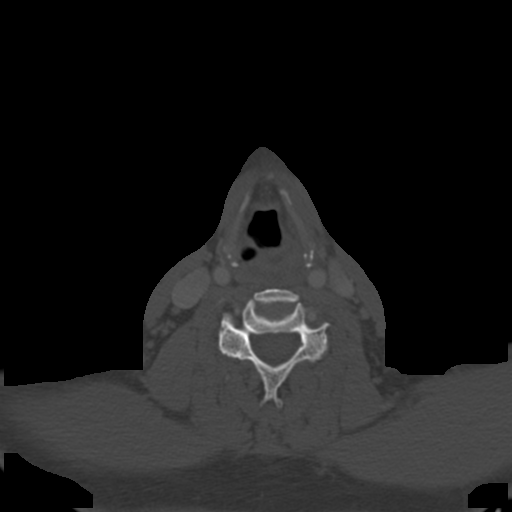
[im 79/118  bone]
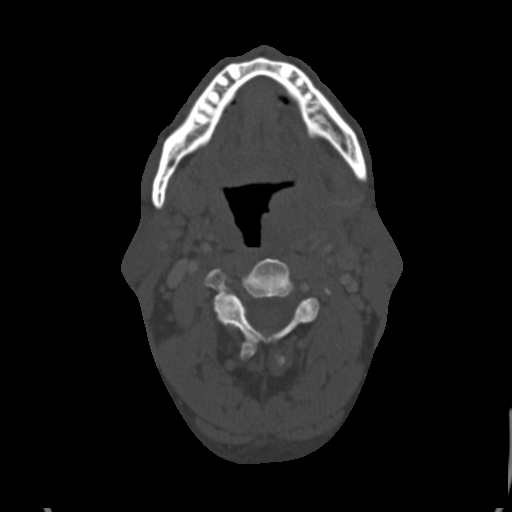
[im 98/118  soft-tissue]
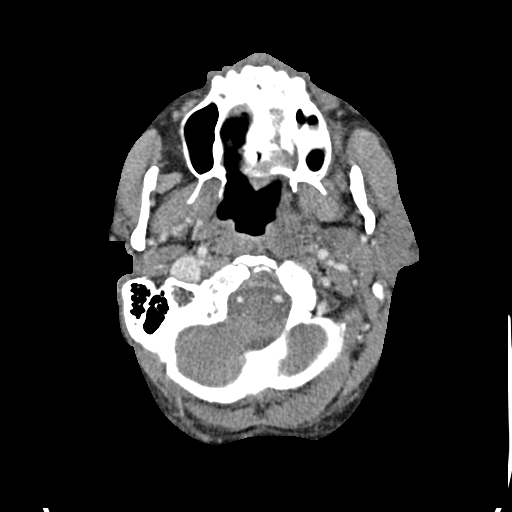
[im 98/118  bone]
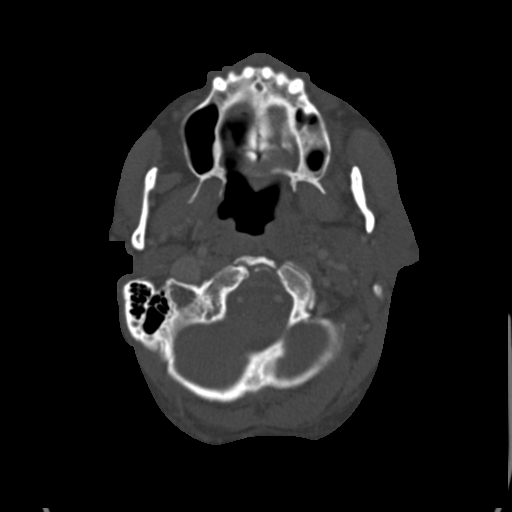

[Series 5: axial · axial · 0.39mm/px · 1 of 117 slices shown]
[im 20/117  bone]
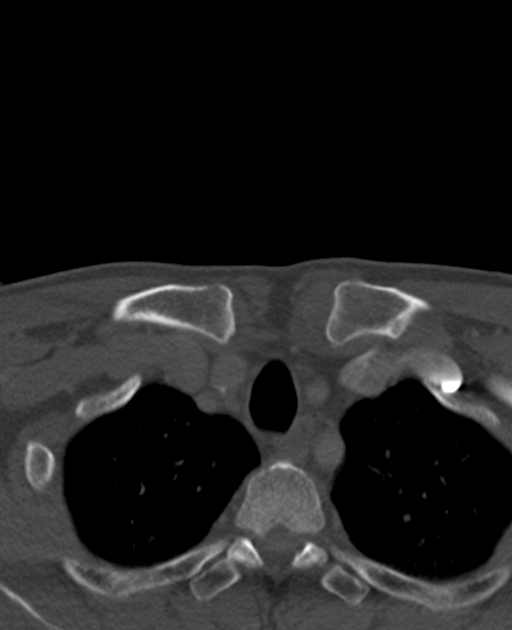

[Series 6: coronal · coronal · 0.36mm/px · 3 of 123 slices shown]
[im 25/123  bone]
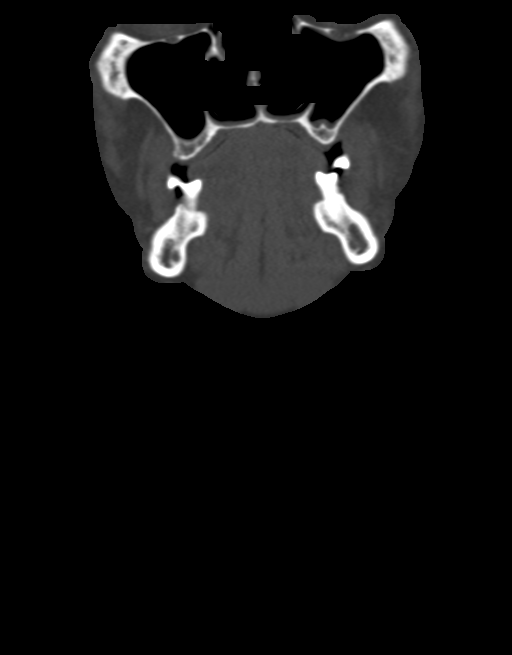
[im 49/123  bone]
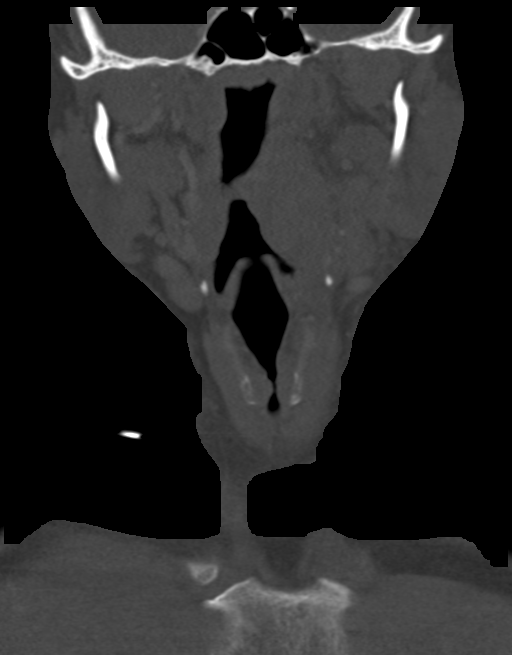
[im 74/123  bone]
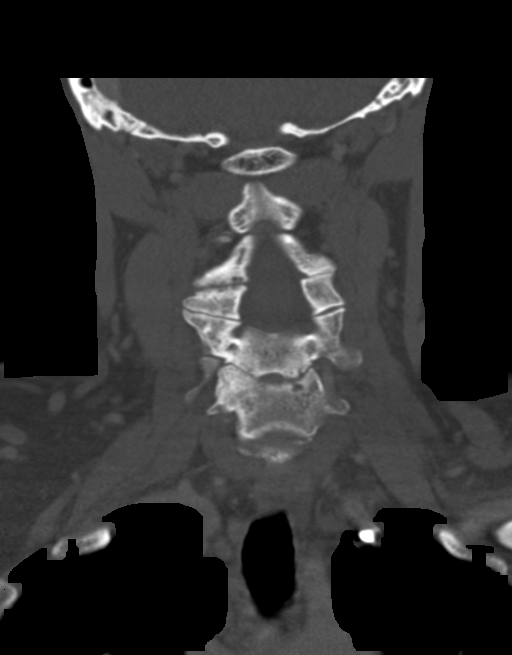

[Series 7: sagittal · sagittal · 0.47mm/px · 5 of 86 slices shown, 6 images]
[im 29/86  bone]
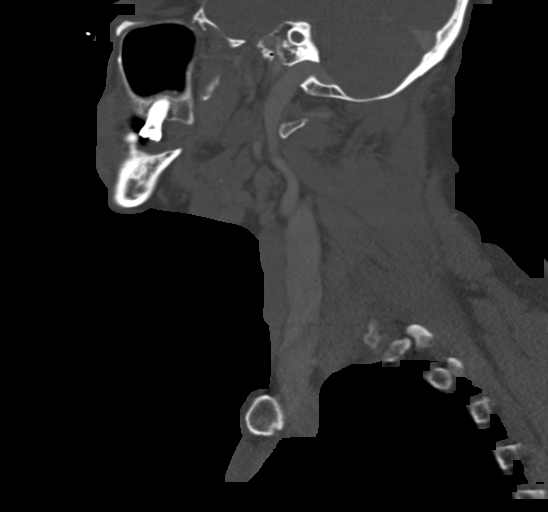
[im 36/86  bone]
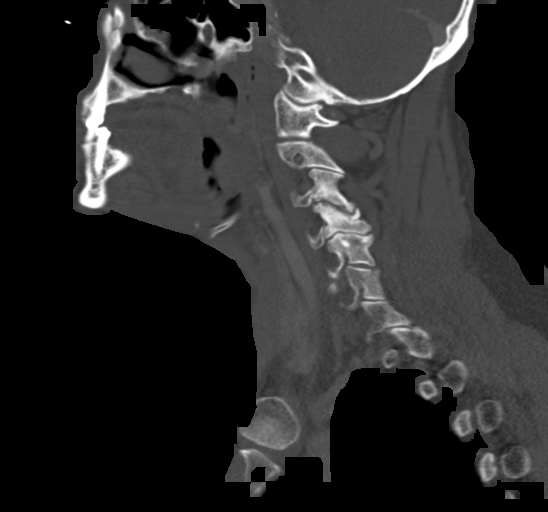
[im 43/86  soft-tissue]
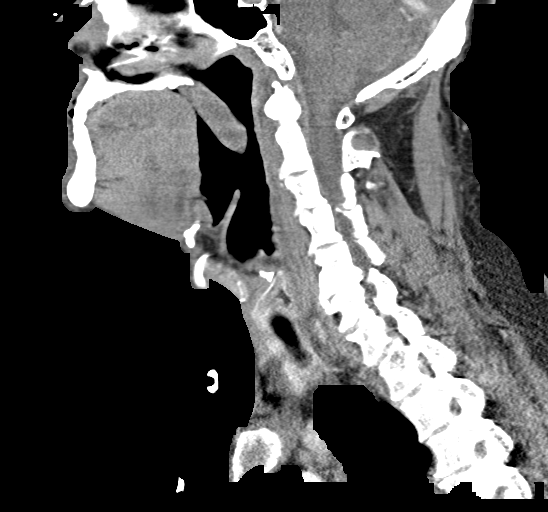
[im 43/86  bone]
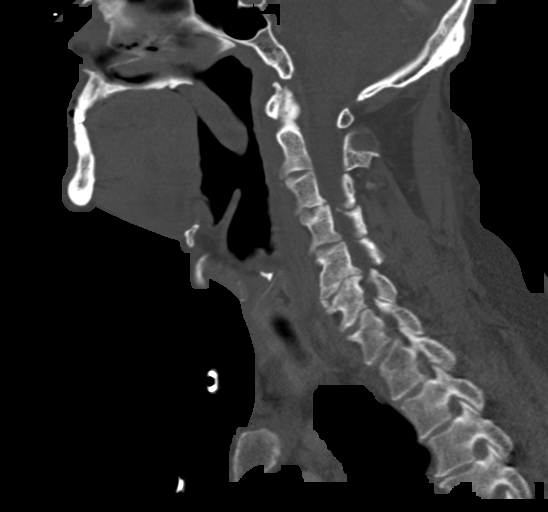
[im 50/86  bone]
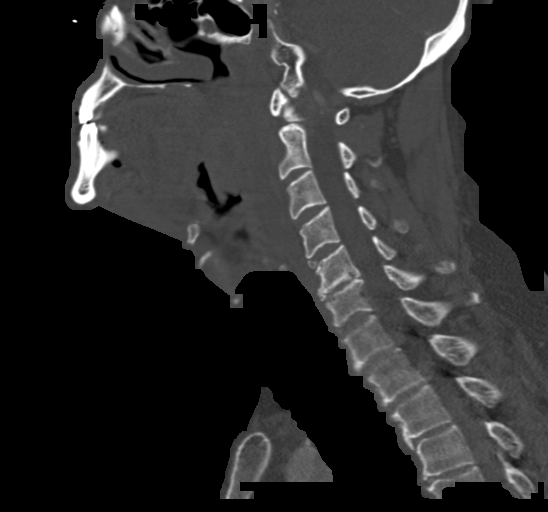
[im 57/86  bone]
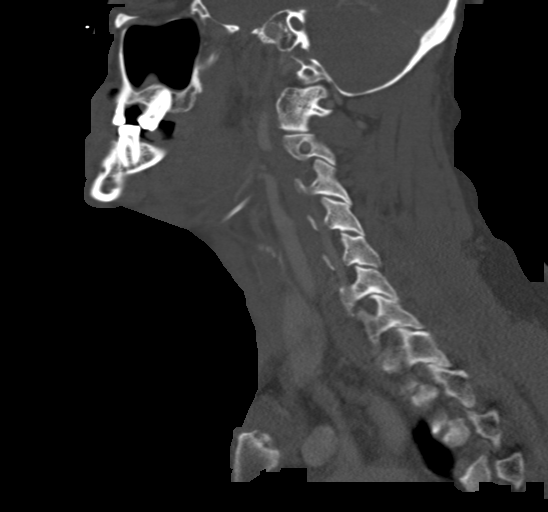

[14 of 33 positions shown; findings below may reference images not displayed]

FINDINGS: Pharynx and larynx: Asymmetric enlargement of the left pharyngeal
tonsil. Ill-defined low-density in the left tonsil measuring 17 mm,
most likely a developing abscess which is not yet liquified. Airway
intact.

Asymmetric vocal cord. Right vocal cord is deviated medially with
asymmetric enlargement of the right piriform sinus suggesting right
cord paresis.

Salivary glands: No inflammation, mass, or stone.

Thyroid: Negative

Lymph nodes: No enlarged lymph nodes in the neck. 11 mm left level 2
lymph node is the largest node.

Vascular: Normal vascular enhancement.

Limited intracranial: Negative

Visualized orbits: Negative

Mastoids and visualized paranasal sinuses: Mild mucosal edema
paranasal sinuses. Mastoid clear bilaterally.

Skeleton: Disc degeneration and spurring most prominent C5-6 on the
right. No acute skeletal abnormality.

Upper chest: Lung apices clear bilaterally.

Other: None
IMPRESSION: Developing 17 mm abscess left tonsil.  Airway intact

Asymmetric vocal cords. Right cord paresis. Correlate with
hoarseness.

## 2024-07-20 DIAGNOSIS — I10 Essential (primary) hypertension: Secondary | ICD-10-CM | POA: Diagnosis not present

## 2024-07-20 DIAGNOSIS — R972 Elevated prostate specific antigen [PSA]: Secondary | ICD-10-CM | POA: Diagnosis not present

## 2024-07-20 DIAGNOSIS — M109 Gout, unspecified: Secondary | ICD-10-CM | POA: Diagnosis not present

## 2024-07-20 DIAGNOSIS — E1169 Type 2 diabetes mellitus with other specified complication: Secondary | ICD-10-CM | POA: Diagnosis not present

## 2024-07-20 DIAGNOSIS — M722 Plantar fascial fibromatosis: Secondary | ICD-10-CM | POA: Diagnosis not present

## 2024-07-20 DIAGNOSIS — E782 Mixed hyperlipidemia: Secondary | ICD-10-CM | POA: Diagnosis not present

## 2024-07-20 DIAGNOSIS — Z0000 Encounter for general adult medical examination without abnormal findings: Secondary | ICD-10-CM | POA: Diagnosis not present
